# Patient Record
Sex: Female | Born: 1950 | Race: White | Hispanic: No | Marital: Single | Smoking: Never smoker
Health system: Southern US, Community
[De-identification: ages and names within clinical notes are randomized; demographics above are authoritative.]

---

## 2016-05-18 ENCOUNTER — Emergency Department (HOSPITAL_COMMUNITY): Payer: Self-pay

## 2016-05-18 ENCOUNTER — Emergency Department (HOSPITAL_COMMUNITY)
Admission: EM | Admit: 2016-05-18 | Discharge: 2016-05-18 | Disposition: A | Discharge status: Home / Self Care | Payer: Self-pay | Attending: Emergency Medicine | Admitting: Emergency Medicine

## 2016-05-18 ENCOUNTER — Encounter (HOSPITAL_COMMUNITY): Payer: Self-pay | Admitting: Emergency Medicine

## 2016-05-18 DIAGNOSIS — S1093XA Contusion of unspecified part of neck, initial encounter: Secondary | ICD-10-CM | POA: Insufficient documentation

## 2016-05-18 DIAGNOSIS — S60221A Contusion of right hand, initial encounter: Secondary | ICD-10-CM | POA: Insufficient documentation

## 2016-05-18 DIAGNOSIS — Z79899 Other long term (current) drug therapy: Secondary | ICD-10-CM | POA: Insufficient documentation

## 2016-05-18 DIAGNOSIS — Y92410 Unspecified street and highway as the place of occurrence of the external cause: Secondary | ICD-10-CM | POA: Insufficient documentation

## 2016-05-18 DIAGNOSIS — Y9389 Activity, other specified: Secondary | ICD-10-CM | POA: Insufficient documentation

## 2016-05-18 DIAGNOSIS — S0033XA Contusion of nose, initial encounter: Secondary | ICD-10-CM | POA: Insufficient documentation

## 2016-05-18 DIAGNOSIS — S20211A Contusion of right front wall of thorax, initial encounter: Secondary | ICD-10-CM | POA: Insufficient documentation

## 2016-05-18 DIAGNOSIS — Y999 Unspecified external cause status: Secondary | ICD-10-CM | POA: Insufficient documentation

## 2016-05-18 LAB — I-STAT CHEM 8, ED
BUN: 11 mg/dL (ref 6–20)
CHLORIDE: 103 mmol/L (ref 101–111)
CREATININE: 0.7 mg/dL (ref 0.44–1.00)
Calcium, Ion: 1.12 mmol/L — ABNORMAL LOW (ref 1.15–1.40)
GLUCOSE: 95 mg/dL (ref 65–99)
HEMATOCRIT: 40 % (ref 36.0–46.0)
Hemoglobin: 13.6 g/dL (ref 12.0–15.0)
POTASSIUM: 4.5 mmol/L (ref 3.5–5.1)
Sodium: 140 mmol/L (ref 135–145)
TCO2: 29 mmol/L (ref 0–100)

## 2016-05-18 MED ORDER — HYDROCODONE-ACETAMINOPHEN 5-325 MG PO TABS: 1.0000 | ORAL_TABLET | Freq: Four times a day (QID) | ORAL | 0 refills | Status: AC | PRN

## 2016-05-18 MED ORDER — HYDROCODONE-ACETAMINOPHEN 5-325 MG PO TABS
2.0000 | ORAL_TABLET | Freq: Once | ORAL | Status: AC
  Administered 2016-05-18: 2 via ORAL
  Filled 2016-05-18: qty 2

## 2016-05-18 MED ORDER — IOPAMIDOL (ISOVUE-300) INJECTION 61%
75.0000 mL | Freq: Once | INTRAVENOUS | Status: AC | PRN
  Administered 2016-05-18: 75 mL via INTRAVENOUS

## 2016-05-18 MED ORDER — OXYCODONE-ACETAMINOPHEN 5-325 MG PO TABS
1.0000 | ORAL_TABLET | ORAL | Status: DC | PRN
  Administered 2016-05-18: 1 via ORAL
  Filled 2016-05-18: qty 1
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
## 2016-05-18 NOTE — ED Notes (Signed)
Right hand is swollen.

## 2016-05-18 NOTE — ED Notes (Signed)
Patient complaining of right rib pain while pushing on the site. Patient also complaining of right wrist pain.
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
## 2016-05-18 NOTE — ED Notes (Signed)
Bed: WTR8 Expected date:  Expected time:  Means of arrival:  Comments: 
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
